# Patient Record
Sex: Male | Born: 1980 | Race: White | Hispanic: No | Marital: Single | Smoking: Never smoker
Health system: Southern US, Community
[De-identification: ages and names within clinical notes are randomized; demographics above are authoritative.]

## PROBLEM LIST (undated history)

## (undated) DIAGNOSIS — R569 Unspecified convulsions: Secondary | ICD-10-CM

## (undated) DIAGNOSIS — F840 Autistic disorder: Secondary | ICD-10-CM

## (undated) HISTORY — PX: EYE SURGERY: SHX253

---

## 2015-10-15 ENCOUNTER — Ambulatory Visit (INDEPENDENT_AMBULATORY_CARE_PROVIDER_SITE_OTHER)
Admit: 2015-10-15 | Discharge: 2015-10-15 | Disposition: A | Discharge status: Home / Self Care | Payer: Self-pay | Attending: Family Medicine | Admitting: Family Medicine

## 2015-10-15 ENCOUNTER — Ambulatory Visit
Admission: EM | Admit: 2015-10-15 | Discharge: 2015-10-15 | Disposition: A | Discharge status: Home / Self Care | Payer: Self-pay | Attending: Family Medicine | Admitting: Family Medicine

## 2015-10-15 DIAGNOSIS — J0100 Acute maxillary sinusitis, unspecified: Secondary | ICD-10-CM

## 2015-10-15 DIAGNOSIS — W19XXXA Unspecified fall, initial encounter: Secondary | ICD-10-CM

## 2015-10-15 DIAGNOSIS — S0093XA Contusion of unspecified part of head, initial encounter: Secondary | ICD-10-CM

## 2015-10-15 HISTORY — DX: Autistic disorder: F84.0

## 2015-10-15 LAB — COMPREHENSIVE METABOLIC PANEL
ALK PHOS: 104 U/L (ref 38–126)
ALT: 30 U/L (ref 17–63)
ANION GAP: 7 (ref 5–15)
AST: 31 U/L (ref 15–41)
Albumin: 4.1 g/dL (ref 3.5–5.0)
BILIRUBIN TOTAL: 0.6 mg/dL (ref 0.3–1.2)
BUN: 13 mg/dL (ref 6–20)
CALCIUM: 9.1 mg/dL (ref 8.9–10.3)
CO2: 28 mmol/L (ref 22–32)
CREATININE: 1.14 mg/dL (ref 0.61–1.24)
Chloride: 102 mmol/L (ref 101–111)
GFR calc non Af Amer: >60 mL/min (ref >60)
GLUCOSE: 117 mg/dL — AB (ref 65–99)
Potassium: 4 mmol/L (ref 3.5–5.1)
Sodium: 137 mmol/L (ref 135–145)
TOTAL PROTEIN: 8.3 g/dL — AB (ref 6.5–8.1)

## 2015-10-15 LAB — CBC WITH DIFFERENTIAL/PLATELET
BASOS ABS: 0.1 10*3/uL (ref 0–0.1)
BASOS PCT: 1 %
EOS ABS: 0.3 10*3/uL (ref 0–0.7)
Eosinophils Relative: 3 %
HEMATOCRIT: 38.6 % — AB (ref 40.0–52.0)
Hemoglobin: 12.9 g/dL — ABNORMAL LOW (ref 13.0–18.0)
Lymphocytes Relative: 11 %
Lymphs Abs: 1 10*3/uL (ref 1.0–3.6)
MCH: 28.5 pg (ref 26.0–34.0)
MCHC: 33.3 g/dL (ref 32.0–36.0)
MCV: 85.7 fL (ref 80.0–100.0)
MONO ABS: 0.5 10*3/uL (ref 0.2–1.0)
MONOS PCT: 5 %
NEUTROS ABS: 7.3 10*3/uL — AB (ref 1.4–6.5)
Neutrophils Relative %: 80 %
PLATELETS: 266 10*3/uL (ref 150–440)
RBC: 4.51 MIL/uL (ref 4.40–5.90)
RDW: 14 % (ref 11.5–14.5)
WBC: 9.2 10*3/uL (ref 3.8–10.6)

## 2015-10-15 LAB — RAPID INFLUENZA A&B ANTIGENS (ARMC ONLY)
INFLUENZA A (ARMC): NOT DETECTED
INFLUENZA B (ARMC): NOT DETECTED

## 2015-10-15 MED ORDER — AMOXICILLIN 400 MG/5ML PO SUSR: 800.0000 mg | Freq: Two times a day (BID) | ORAL | Status: AC

## 2015-10-15 NOTE — ED Provider Notes (Signed)
CSN: [id number]     Arrival date & time 10/15/15  1052 History   First MD Initiated Contact with Patient 10/15/15 1108     Chief Complaint  Patient presents with  . Fall  . Head Injury   (Consider location/radiation/quality/duration/timing/severity/associated sxs/prior Treatment) HPI Comments: 35 yo male with a h/o Autism, presents with mom with a complaint of a fall off a chair at home this morning. Patient states was getting up and not sure if he tripped over something, but fell and hit his head on the floor. Patient states he's not sure if he fainted and fall was not witnessed. Mother heard him fall and came to his room and found him alert, awake and getting up from the floor. Patient denies any vision changes, numbness, tingling, one-sided weakness. No h/o seizures. States has had a "cold" for the past 2 weeks with more recent sinus pressure and congestion.   The history is provided by the patient and a parent.    Past Medical History  Diagnosis Date  . Autism    Past Surgical History  Procedure Laterality Date  . Eye surgery     Family History  Problem Relation Age of Onset  . Hypertension Mother   . Diabetes Father    Social History  Substance Use Topics  . Smoking status: Never Smoker   . Smokeless tobacco: None  . Alcohol Use: No    Review of Systems  Allergies  Review of patient's allergies indicates no known allergies.  Home Medications   Prior to Admission medications   Medication Sig Start Date End Date Taking? Authorizing Provider  amoxicillin (AMOXIL) 400 MG/5ML suspension Take 10 mLs (800 mg total) by mouth 2 (two) times daily. 10/15/15 10/22/15  Payton Mccallum, MD   Meds Ordered and Administered this Visit  Medications - No data to display  BP 93/65 mmHg  Pulse 91  Temp(Src) 98.7 F (37.1 C) (Tympanic)  Resp 16  Ht  (1.676 m)  Wt 275 lb (124.739 kg)  BMI 44.41 kg/m2  SpO2 97% No data found.   Physical Exam  Constitutional: He is oriented  to person, place, and time. He appears well-developed and well-nourished. No distress.  HENT:  Head: Normocephalic. Head is with abrasion and with contusion. Head is without raccoon's eyes, without laceration, without right periorbital erythema and without left periorbital erythema.  Right Ear: Tympanic membrane, external ear and ear canal normal.  Left Ear: Tympanic membrane, external ear and ear canal normal.  Nose: Mucosal edema present. Right sinus exhibits maxillary sinus tenderness. Left sinus exhibits maxillary sinus tenderness.  Mouth/Throat: Uvula is midline, oropharynx is clear and moist and mucous membranes are normal. No oropharyngeal exudate or tonsillar abscesses.  Eyes: Conjunctivae and EOM are normal. Pupils are equal, round, and reactive to light. Right eye exhibits no discharge. Left eye exhibits no discharge. No scleral icterus.  Neck: Normal range of motion. Neck supple. No tracheal deviation present. No thyromegaly present.  Cardiovascular: Normal rate, regular rhythm and normal heart sounds.   Pulmonary/Chest: Effort normal and breath sounds normal. No stridor. No respiratory distress. He has no wheezes. He has no rales. He exhibits no tenderness.  Lymphadenopathy:    He has no cervical adenopathy.  Neurological: He is alert and oriented to person, place, and time. He has normal reflexes. He displays normal reflexes. No cranial nerve deficit. He exhibits normal muscle tone. Coordination normal.  Skin: Skin is warm and dry. No rash noted. He is not diaphoretic.  Nursing note and vitals reviewed.   ED Course  Procedures (including critical care time)  Labs Review Labs Reviewed  COMPREHENSIVE METABOLIC PANEL - Abnormal; Notable for the following:    Glucose, Bld 117 (*)    Total Protein 8.3 (*)    All other components within normal limits  CBC WITH DIFFERENTIAL/PLATELET - Abnormal; Notable for the following:    Hemoglobin 12.9 (*)    HCT 38.6 (*)    Neutro Abs 7.3 (*)     All other components within normal limits  RAPID INFLUENZA A&B ANTIGENS ([facility] ONLY)    Imaging Review Ct Head Wo Contrast  10/15/2015  CLINICAL DATA:  Fall from chair with facial injury EXAM: CT HEAD WITHOUT CONTRAST CT MAXILLOFACIAL WITHOUT CONTRAST TECHNIQUE: Multidetector CT imaging of the head and maxillofacial structures were performed using the standard protocol without intravenous contrast. Multiplanar CT image reconstructions of the maxillofacial structures were also generated. COMPARISON:  None. FINDINGS: CT HEAD FINDINGS The bony calvarium is intact. The ventricles are of normal size and configuration. No findings to suggest acute hemorrhage, acute infarction or space-occupying mass lesion are noted. CT MAXILLOFACIAL FINDINGS No acute bony abnormality is noted. The paranasal sinuses are within normal limits with the exception of mucosal thickening within the right half of the sphenoid sinus. Slight deviation of the nasal septum to the left is noted. The orbits and their contents are within normal limits. Scattered small symmetrical lymph nodes are noted in the cervical chains. IMPRESSION: CT of the head:  No acute abnormality noted. CT of the maxillofacial bones: Mild mucosal changes in the right sphenoid sinus. No acute bony abnormality is seen. Electronically Signed   By: Alcide Clever M.D.   On: 10/15/2015 11:57   Ct Maxillofacial Wo Cm  10/15/2015  CLINICAL DATA:  Fall from chair with facial injury EXAM: CT HEAD WITHOUT CONTRAST CT MAXILLOFACIAL WITHOUT CONTRAST TECHNIQUE: Multidetector CT imaging of the head and maxillofacial structures were performed using the standard protocol without intravenous contrast. Multiplanar CT image reconstructions of the maxillofacial structures were also generated. COMPARISON:  None. FINDINGS: CT HEAD FINDINGS The bony calvarium is intact. The ventricles are of normal size and configuration. No findings to suggest acute hemorrhage, acute infarction or  space-occupying mass lesion are noted. CT MAXILLOFACIAL FINDINGS No acute bony abnormality is noted. The paranasal sinuses are within normal limits with the exception of mucosal thickening within the right half of the sphenoid sinus. Slight deviation of the nasal septum to the left is noted. The orbits and their contents are within normal limits. Scattered small symmetrical lymph nodes are noted in the cervical chains. IMPRESSION: CT of the head:  No acute abnormality noted. CT of the maxillofacial bones: Mild mucosal changes in the right sphenoid sinus. No acute bony abnormality is seen. Electronically Signed   By: Alcide Clever M.D.   On: 10/15/2015 11:57     Visual Acuity Review  Right Eye Distance:   Left Eye Distance:   Bilateral Distance:    Right Eye Near:   Left Eye Near:    Bilateral Near:       Recheck pulse: 91  MDM   1. Fall, initial encounter   2. Fall   3. Head contusion, initial encounter   4. Acute maxillary sinusitis, recurrence not specified    Discharge Medication List as of 10/15/2015 12:14 PM    START taking these medications   Details  amoxicillin (AMOXIL) 400 MG/5ML suspension Take 10 mLs (800 mg total) by  mouth 2 (two) times daily., Starting 10/15/2015, Until Tue 10/22/15, Normal       1. Labs/x-ray results and diagnosis reviewed with patient and parent 2. rx as per orders above; reviewed possible side effects, interactions, risks and benefits  3. Recommend supportive treatment with increased fluids, otc analgesics, ice 4. Recommend establish care with a PCP 5. Follow-up prn if symptoms worsen or don't improve    Payton Mccallum, MD 10/15/15 1621

## 2015-10-15 NOTE — ED Notes (Signed)
Mom states patient fell off chair and landed on head. No recollection of syncope. "I think I passed out". Large abrasion and hematoma forehead. A&Ox3 on assessment by Dr. Judd Gaudier

## 2017-03-24 ENCOUNTER — Ambulatory Visit
Admission: EM | Admit: 2017-03-24 | Discharge: 2017-03-24 | Disposition: A | Discharge status: Home / Self Care | Payer: Medicaid Other | Attending: Family Medicine | Admitting: Family Medicine

## 2017-03-24 ENCOUNTER — Encounter: Payer: Self-pay | Admitting: *Deleted

## 2017-03-24 DIAGNOSIS — J029 Acute pharyngitis, unspecified: Secondary | ICD-10-CM | POA: Diagnosis not present

## 2017-03-24 DIAGNOSIS — J0100 Acute maxillary sinusitis, unspecified: Secondary | ICD-10-CM

## 2017-03-24 DIAGNOSIS — R05 Cough: Secondary | ICD-10-CM | POA: Diagnosis not present

## 2017-03-24 HISTORY — DX: Unspecified convulsions: R56.9

## 2017-03-24 MED ORDER — AMOXICILLIN 875 MG PO TABS: 875.0000 mg | ORAL_TABLET | Freq: Two times a day (BID) | ORAL | 0 refills | Status: DC

## 2017-03-24 MED ORDER — AMOXICILLIN 400 MG/5ML PO SUSR
ORAL | 0 refills | Status: DC
Start: 2017-03-24 — End: 2021-03-11

## 2017-03-24 NOTE — ED Provider Notes (Signed)
[facility]    CSN: [id number] Arrival date & time: 03/24/17  1147     History   Chief Complaint Chief Complaint  Patient presents with  . Cough  . Sore Throat  . Nasal Congestion    HPI Donald HornsDaniel Despain is a 36 y.o. male.   The history is provided by the patient.  Cough  Associated symptoms: headaches, rhinorrhea and sore throat   Associated symptoms: no wheezing   Sore Throat  Associated symptoms include headaches.  URI  Presenting symptoms: congestion, cough, facial pain, rhinorrhea and sore throat   Severity:  Moderate Onset quality:  Sudden Duration:  2 weeks Timing:  Constant Progression:  Worsening Chronicity:  New Relieved by:  None tried Ineffective treatments:  None tried Associated symptoms: headaches and sinus pain   Associated symptoms: no wheezing   Risk factors: not elderly, no chronic cardiac disease, no chronic kidney disease, no chronic respiratory disease, no diabetes mellitus, no immunosuppression, no recent illness, no recent travel and no sick contacts     Past Medical History:  Diagnosis Date  . Autism   . Seizures (HCC)     There are no active problems to display for this patient.   Past Surgical History:  Procedure Laterality Date  . EYE SURGERY         Home Medications    Prior to Admission medications   Medication Sig Start Date End Date Taking? Authorizing Provider  levETIRAcetam (KEPPRA) 100 MG/ML solution Take 1,000 mg by mouth 2 (two) times daily.   Yes [provider]  amoxicillin (AMOXIL) 400 MG/5ML suspension 12 ml po bid for 10 days 03/24/17   Payton Mccallumonty, Bobbye Petti, MD    Family History Family History  Problem Relation Age of Onset  . Hypertension Mother   . Diabetes Father     Social History Social History  Substance Use Topics  . Smoking status: Never Smoker  . Smokeless tobacco: Never Used  . Alcohol use No     Allergies   Patient has no known allergies.   Review of Systems Review  of Systems  HENT: Positive for congestion, rhinorrhea, sinus pain and sore throat.   Respiratory: Positive for cough. Negative for wheezing.   Neurological: Positive for headaches.     Physical Exam Triage Vital Signs ED Triage Vitals  Enc Vitals Group     BP 03/24/17 1159 109/73     Pulse Rate 03/24/17 1159 61     Resp 03/24/17 1159 16     Temp 03/24/17 1159 98.5 F (36.9 C)     Temp Source 03/24/17 1159 Oral     SpO2 03/24/17 1159 98 %     Weight 03/24/17 1201 256 lb (116.1 kg)     Height 03/24/17 1201 5\' 5"  (1.651 m)     Head Circumference --      Peak Flow --      Pain Score --      Pain Loc --      Pain Edu? --      Excl. in GC? --    No data found.   Updated Vital Signs BP 109/73 (BP Location: Left Arm)   Pulse 61   Temp 98.5 F (36.9 C) (Oral)   Resp 16   Ht 5\' 5"  (1.651 m)   Wt 256 lb (116.1 kg)   SpO2 98%   BMI 42.60 kg/m   Visual Acuity Right Eye Distance:   Left Eye Distance:   Bilateral Distance:  Right Eye Near:   Left Eye Near:    Bilateral Near:     Physical Exam  Constitutional: He appears well-developed and well-nourished. No distress.  HENT:  Head: Normocephalic and atraumatic.  Right Ear: Tympanic membrane, external ear and ear canal normal.  Left Ear: Tympanic membrane, external ear and ear canal normal.  Nose: Right sinus exhibits maxillary sinus tenderness. Right sinus exhibits no frontal sinus tenderness. Left sinus exhibits maxillary sinus tenderness. Left sinus exhibits no frontal sinus tenderness.  Mouth/Throat: Uvula is midline, oropharynx is clear and moist and mucous membranes are normal. No oropharyngeal exudate or tonsillar abscesses.  Eyes: Pupils are equal, round, and reactive to light. Conjunctivae and EOM are normal. Right eye exhibits no discharge. Left eye exhibits no discharge. No scleral icterus.  Neck: Normal range of motion. Neck supple. No tracheal deviation present. No thyromegaly present.  Cardiovascular:  Normal rate, regular rhythm and normal heart sounds.   Pulmonary/Chest: Effort normal and breath sounds normal. No stridor. No respiratory distress. He has no wheezes. He has no rales. He exhibits no tenderness.  Lymphadenopathy:    He has no cervical adenopathy.  Neurological: He is alert.  Skin: Skin is warm and dry. No rash noted. He is not diaphoretic.  Nursing note and vitals reviewed.    UC Treatments / Results  Labs (all labs ordered are listed, but only abnormal results are displayed) Labs Reviewed - No data to display  EKG  EKG Interpretation None       Radiology No results found.  Procedures Procedures (including critical care time)  Medications Ordered in UC Medications - No data to display   Initial Impression / Assessment and Plan / UC Course  I have reviewed the triage vital signs and the nursing notes.  Pertinent labs & imaging results that were available during my care of the patient were reviewed by me and considered in my medical decision making (see chart for details).       Final Clinical Impressions(s) / UC Diagnoses   Final diagnoses:  Acute maxillary sinusitis, recurrence not specified    New Prescriptions Discharge Medication List as of 03/24/2017 12:10 PM    START taking these medications   Details  amoxicillin (AMOXIL) 875 MG tablet Take 1 tablet (875 mg total) by mouth 2 (two) times daily., Starting Wed 03/24/2017, Normal       1. diagnosis reviewed with patient 2. rx as per orders above; reviewed possible side effects, interactions, risks and benefits  3. Recommend supportive treatment with otc flonase 4. Follow-up prn if symptoms worsen or don't improve   Payton Mccallumonty, Shalawn Wynder, MD 03/24/17 [phone]

## 2017-03-24 NOTE — ED Triage Notes (Signed)
Sore throat, non-productive cough, runny nose, x1 week. Pt dx with seizure disorder 1 year ago and last seizure was 1 week ago, states awoke from seizure with sore throat that hasn't resolved.

## 2017-08-06 ENCOUNTER — Encounter: Payer: Self-pay | Admitting: *Deleted

## 2017-08-06 ENCOUNTER — Ambulatory Visit
Admission: EM | Admit: 2017-08-06 | Discharge: 2017-08-06 | Disposition: A | Discharge status: Home / Self Care | Payer: Medicaid Other | Attending: Family Medicine | Admitting: Family Medicine

## 2017-08-06 ENCOUNTER — Other Ambulatory Visit: Payer: Self-pay

## 2017-08-06 DIAGNOSIS — R05 Cough: Secondary | ICD-10-CM | POA: Diagnosis not present

## 2017-08-06 DIAGNOSIS — R059 Cough, unspecified: Secondary | ICD-10-CM

## 2017-08-06 DIAGNOSIS — J069 Acute upper respiratory infection, unspecified: Secondary | ICD-10-CM | POA: Diagnosis not present

## 2017-08-06 DIAGNOSIS — R030 Elevated blood-pressure reading, without diagnosis of hypertension: Secondary | ICD-10-CM

## 2017-08-06 NOTE — Discharge Instructions (Signed)
Advised Mucinex DM OTC. Keep BP log x 2 weeks and FU with PCP. Tylenol/Motrin as needed Increase rest and hydration

## 2017-08-06 NOTE — ED Provider Notes (Signed)
[facility]    CSN: [id number] Arrival date & time: 08/06/17  1454     History   Chief Complaint Chief Complaint  Patient presents with  . Cough  . Nasal Congestion    HPI Donald Watson is a 36 y.o. male.   The history is provided by the patient.  Cough  Cough characteristics:  Dry and productive Sputum characteristics:  Clear Severity:  Moderate Onset quality:  Gradual Duration:  2 weeks Timing:  Constant Denies fever/chills  Past Medical History:  Diagnosis Date  . Autism   . Seizures (HCC)     There are no active problems to display for this patient.   Past Surgical History:  Procedure Laterality Date  . EYE SURGERY         Home Medications    Prior to Admission medications   Medication Sig Start Date End Date Taking? Authorizing Provider  levETIRAcetam (KEPPRA) 100 MG/ML solution Take 1,000 mg by mouth 2 (two) times daily.   Yes [provider]  amoxicillin (AMOXIL) 400 MG/5ML suspension 12 ml po bid for 10 days 03/24/17   Payton Mccallumonty, [place], MD    Family History Family History  Problem Relation Age of Onset  . Hypertension Mother   . Diabetes Father     Social History Social History   Tobacco Use  . Smoking status: Never Smoker  . Smokeless tobacco: Never Used  Substance Use Topics  . Alcohol use: No  . Drug use: No     Allergies   Patient has no known allergies.   Review of Systems Review of Systems  Constitutional: Negative.   HENT: Positive for congestion.   Respiratory: Positive for cough.   Skin: Negative.   Neurological: Negative.   Psychiatric/Behavioral: Negative.      Physical Exam Triage Vital Signs ED Triage Vitals  Enc Vitals Group     BP 08/06/17 1514 (!) 188/70     Pulse Rate 08/06/17 1514 82     Resp 08/06/17 1514 18     Temp 08/06/17 1514 98.4 F (36.9 C)     Temp Source 08/06/17 1514 Oral     SpO2 08/06/17 1514 96 %     Weight 08/06/17 1515 255 lb (115.7 kg)     Height --    Head Circumference --      Peak Flow --      Pain Score 08/06/17 1516 0     Pain Loc --      Pain Edu? --      Excl. in GC? --    No data found.  Updated Vital Signs BP (!) 188/70 (BP Location: Left Arm)   Pulse 82   Temp 98.4 F (36.9 C) (Oral)   Resp 18   Wt 255 lb (115.7 kg)   SpO2 96%   BMI 42.43 kg/m   Visual Acuity Right Eye Distance:   Left Eye Distance:   Bilateral Distance:    Right Eye Near:   Left Eye Near:    Bilateral Near:     Physical Exam  Constitutional: He is oriented to person, place, and time. He appears well-developed and well-nourished.  Eyes: Pupils are equal, round, and reactive to light.  Neck: Normal range of motion.  Cardiovascular: Normal rate, regular rhythm and normal heart sounds.  Pulmonary/Chest: Effort normal and breath sounds normal.  Cough with clear to yellow expectorant   Neurological: He is alert and oriented to person, place, and time.  Skin: Skin is  warm.     UC Treatments / Results  Labs (all labs ordered are listed, but only abnormal results are displayed) Labs Reviewed - No data to display  EKG  EKG Interpretation None       Radiology No results found.  Procedures Procedures (including critical care time)  Medications Ordered in UC Medications - No data to display   Initial Impression / Assessment and Plan / UC Course  I have reviewed the triage vital signs and the nursing notes.  Pertinent labs & imaging results that were available during my care of the patient were reviewed by me and considered in my medical decision making (see chart for details).    BP reading elevated. Findings discussed with patient and mother. Keep BP log x 2 weeks and FU with PCP.  Final Clinical Impressions(s) / UC Diagnoses   Final diagnoses:  Acute upper respiratory infection  Cough  Elevated BP without diagnosis of hypertension    ED Discharge Orders    None       Controlled Substance Prescriptions [place]  Controlled Substance Registry consulted? Not Applicable   Reinaldo RaddleMultani, Ricard Faulkner, NP 08/06/17 1557

## 2017-08-06 NOTE — ED Triage Notes (Signed)
Patient started having symptoms of cough and nasal congestion 3 weeks ago.

## 2021-03-11 ENCOUNTER — Ambulatory Visit
Admission: EM | Admit: 2021-03-11 | Discharge: 2021-03-11 | Disposition: A | Discharge status: Home / Self Care | Payer: Medicaid Other | Attending: Emergency Medicine | Admitting: Emergency Medicine

## 2021-03-11 ENCOUNTER — Other Ambulatory Visit: Payer: Self-pay

## 2021-03-11 DIAGNOSIS — L409 Psoriasis, unspecified: Secondary | ICD-10-CM

## 2021-03-11 MED ORDER — BETAMETHASONE DIPROPIONATE 0.05 % EX OINT: TOPICAL_OINTMENT | Freq: Two times a day (BID) | CUTANEOUS | 0 refills | Status: AC

## 2021-03-11 NOTE — ED Triage Notes (Signed)
Pt c/o rash on both forearms for about 2 weeks, pt states areas do not itch. Mom has applied alcohol and abx ointment, none have helped. Pt does have hx of psoriasis.

## 2021-03-11 NOTE — Discharge Instructions (Addendum)
Apply the betamethasone ointment twice daily to the rash to help calm down inflammation.  Following your bath or shower use a thick moisturizing lotion such as Eucerin or CeraVe a.  This will help return moisture to the skin and help calm down some of the inflammation and irritation of the psoriasis.  If your symptoms do not improve, or they worsen, return for reevaluation or see your primary care provider.

## 2021-03-11 NOTE — ED Provider Notes (Signed)
[facility]    CSN: [id number] Arrival date & time: 03/11/21  1329      History   Chief Complaint Chief Complaint  Patient presents with   Rash    HPI Kou Gucciardo is a 40 y.o. male.   HPI  40 year old male here for evaluation of rash.  Patient is here with his mother and reports that he has been experiencing a red rash on both arms with the left being greater than the right for the past 2 weeks.  Mom reports that she has been using topical antibiotic ointment but has not improved the rash.  Patient denies any itching, heat, or drainage from the wound.  They are red raised plaques and both mom and patient deny any silver flaky crust.  She does have a history of psoriasis.  Past Medical History:  Diagnosis Date   Autism    Seizures (HCC)     There are no problems to display for this patient.   Past Surgical History:  Procedure Laterality Date   EYE SURGERY         Home Medications    Prior to Admission medications   Medication Sig Start Date End Date Taking? Authorizing Provider  betamethasone dipropionate (DIPROLENE) 0.05 % ointment Apply topically 2 (two) times daily. 03/11/21  Yes Becky Augusta, NP  levETIRAcetam (KEPPRA) 100 MG/ML solution Take 1,000 mg by mouth 2 (two) times daily.   Yes [provider]    Family History Family History  Problem Relation Age of Onset   Hypertension Mother    Diabetes Father     Social History Social History   Tobacco Use   Smoking status: Never   Smokeless tobacco: Never  Substance Use Topics   Alcohol use: No   Drug use: No     Allergies   Patient has no known allergies.   Review of Systems Review of Systems  Constitutional:  Negative for activity change and fever.  Skin:  Positive for rash.    Physical Exam Triage Vital Signs ED Triage Vitals  Enc Vitals Group     BP 03/11/21 1348 115/71     Pulse Rate 03/11/21 1348 75     Resp 03/11/21 1348 18     Temp 03/11/21 1348 98.3  F (36.8 C)     Temp Source 03/11/21 1348 Oral     SpO2 03/11/21 1348 98 %     Weight 03/11/21 1347 245 lb (111.1 kg)     Height 03/11/21 1347 5\' 6"  (1.676 m)     Head Circumference --      Peak Flow --      Pain Score 03/11/21 1347 0     Pain Loc --      Pain Edu? --      Excl. in GC? --    No data found.  Updated Vital Signs BP 115/71 (BP Location: Left Arm)   Pulse 75   Temp 98.3 F (36.8 C) (Oral)   Resp 18   Ht 5\' 6"  (1.676 m)   Wt 245 lb (111.1 kg)   SpO2 98%   BMI 39.54 kg/m   Visual Acuity Right Eye Distance:   Left Eye Distance:   Bilateral Distance:    Right Eye Near:   Left Eye Near:    Bilateral Near:     Physical Exam Vitals and nursing note reviewed.  Constitutional:      General: He is not in acute distress.    Appearance:  Normal appearance. He is not ill-appearing.  Skin:    General: Skin is warm and dry.     Capillary Refill: Capillary refill takes less than 2 seconds.     Findings: Erythema and rash present.  Neurological:     General: No focal deficit present.     Mental Status: He is alert and oriented to person, place, and time.  Psychiatric:        Mood and Affect: Mood normal.        Behavior: Behavior normal.        Thought Content: Thought content normal.        Judgment: Judgment normal.     UC Treatments / Results  Labs (all labs ordered are listed, but only abnormal results are displayed) Labs Reviewed - No data to display  EKG   Radiology No results found.  Procedures Procedures (including critical care time)  Medications Ordered in UC Medications - No data to display  Initial Impression / Assessment and Plan / UC Course  I have reviewed the triage vital signs and the nursing notes.  Pertinent labs & imaging results that were available during my care of the patient were reviewed by me and considered in my medical decision making (see chart for details).  Patient is a very pleasant 40 year old male with a  history of autism that is here for evaluation of rash on both forearms that is been present for the past 2 weeks as outlined in HPI above.  Patient's physical exam reveals the presence of a rash primarily on the dorsal and lateral surface of the left forearm and in the lateral antecubital fossa creases of both forearms.  Patient does have a scattered patch further down on the right forearm as well.  The rash is erythematous and raised in patches.  There is no Superflex skin on top.  The rash is not hot to the touch and patient does not complain of any itching.  There is no drainage from the wound.  Patient's exam is consistent with psoriasis.  We will treat patient with topical triamcinolone twice daily to calm down inflammation and I have instructed mom and patient to apply a thick emollient moisturizing lotion such as Eucerin or CeraVe a immediately following the shower or bath so that the skin is most receptive to receiving the moisture.   Final Clinical Impressions(s) / UC Diagnoses   Final diagnoses:  Psoriasis     Discharge Instructions      Apply the betamethasone ointment twice daily to the rash to help calm down inflammation.  Following your bath or shower use a thick moisturizing lotion such as Eucerin or CeraVe a.  This will help return moisture to the skin and help calm down some of the inflammation and irritation of the psoriasis.  If your symptoms do not improve, or they worsen, return for reevaluation or see your primary care provider.     ED Prescriptions     Medication Sig Dispense Auth. Provider   betamethasone dipropionate (DIPROLENE) 0.05 % ointment Apply topically 2 (two) times daily. 30 g Becky Augusta, NP      PDMP not reviewed this encounter.   Becky Augusta, NP 03/11/21 1410

## 2021-04-22 ENCOUNTER — Other Ambulatory Visit: Payer: Self-pay | Admitting: Neurology

## 2021-04-22 DIAGNOSIS — R569 Unspecified convulsions: Secondary | ICD-10-CM

## 2021-04-23 ENCOUNTER — Other Ambulatory Visit: Payer: Self-pay | Admitting: Neurology

## 2021-04-23 DIAGNOSIS — R569 Unspecified convulsions: Secondary | ICD-10-CM

## 2021-04-30 ENCOUNTER — Other Ambulatory Visit: Payer: Self-pay

## 2021-04-30 ENCOUNTER — Ambulatory Visit
Admission: RE | Admit: 2021-04-30 | Discharge: 2021-04-30 | Disposition: A | Discharge status: Home / Self Care | Payer: Medicaid Other | Source: Ambulatory Visit | Attending: Neurology | Admitting: Neurology

## 2021-04-30 DIAGNOSIS — R569 Unspecified convulsions: Secondary | ICD-10-CM | POA: Diagnosis not present

## 2021-04-30 MED ORDER — GADOBUTROL 1 MMOL/ML IV SOLN
10.0000 mL | Freq: Once | INTRAVENOUS | Status: AC | PRN
  Administered 2021-04-30: 10 mL via INTRAVENOUS

## 2022-02-27 ENCOUNTER — Ambulatory Visit
Admission: EM | Admit: 2022-02-27 | Discharge: 2022-02-27 | Disposition: A | Discharge status: Home / Self Care | Payer: Medicaid Other | Attending: Emergency Medicine | Admitting: Emergency Medicine

## 2022-02-27 ENCOUNTER — Encounter: Payer: Self-pay | Admitting: Emergency Medicine

## 2022-02-27 DIAGNOSIS — R21 Rash and other nonspecific skin eruption: Secondary | ICD-10-CM | POA: Diagnosis not present

## 2022-02-27 MED ORDER — PREDNISOLONE 15 MG/5ML PO SOLN: ORAL | 0 refills | Status: DC

## 2022-02-27 NOTE — Discharge Instructions (Addendum)
The cause of your rash is unknown at this time, at this time it does not appear to be infectious rash and therefore we will move forward with treatment for inflammation  Begin use of prednisone every morning with food as directed on packaging  If itching begins to recur you may use an oral antihistamine such as Zyrtec or Claritin or a topical product such as calamine lotion  If pain begins you may use Tylenol or ibuprofen every 6 hours as needed for general comfort  Avoid long exposure to high heat such as showers or outside as this may cause further irritation to the rash  At any point if your symptoms persist or worsen you may follow-up with urgent care for reevaluation

## 2022-02-27 NOTE — ED Triage Notes (Signed)
Patient c/o red rash on his right upper arm that started earlier this week.  Patient denies itching or pain.  Patient has been putting hydrocortisone on the area.

## 2022-02-27 NOTE — ED Provider Notes (Signed)
[facility]    CSN: [id number] Arrival date & time: 02/27/22  1048      History   Chief Complaint Chief Complaint  Patient presents with   Rash    HPI Donald Watson is a 41 y.o. male.   Patient presents with erythematous smooth oriented rash to the right upper arm beginning 7 days ago.  Has attempted use of hydrocortisone cream which has been ineffective.  Denies pruritus, drainage, pain, fever, chills.  Denies changes in soaps, lotions detergents fragrance, hair products, dietary changes or recent travel.  Denies exposure to wooded or implanted areas.  No other member of household has similar rash.  History of psoriasis but endorses presentation of new rash is not consistent.  History of autism and seizures.   Past Medical History:  Diagnosis Date   Autism    Seizures (HCC)     There are no problems to display for this patient.   Past Surgical History:  Procedure Laterality Date   EYE SURGERY         Home Medications    Prior to Admission medications   Medication Sig Start Date End Date Taking? Authorizing Provider  levETIRAcetam (KEPPRA) 100 MG/ML solution Take 1,000 mg by mouth 2 (two) times daily.   Yes [provider]  betamethasone dipropionate (DIPROLENE) 0.05 % ointment Apply topically 2 (two) times daily. 03/11/21   Becky Augusta, NP    Family History Family History  Problem Relation Age of Onset   Hypertension Mother    Diabetes Father     Social History Social History   Tobacco Use   Smoking status: Never   Smokeless tobacco: Never  Vaping Use   Vaping Use: Never used  Substance Use Topics   Alcohol use: No   Drug use: No     Allergies   Patient has no known allergies.   Review of Systems Review of Systems  Constitutional: Negative.   Respiratory: Negative.    Cardiovascular: Negative.   Skin:  Positive for rash. Negative for color change, pallor and wound.  Neurological: Negative.      Physical  Exam Triage Vital Signs ED Triage Vitals  Enc Vitals Group     BP 02/27/22 1116 109/77     Pulse Rate 02/27/22 1116 66     Resp 02/27/22 1116 15     Temp 02/27/22 1116 98.6 F (37 C)     Temp Source 02/27/22 1116 Oral     SpO2 02/27/22 1116 96 %     Weight 02/27/22 1114 272 lb (123.4 kg)     Height 02/27/22 1114 5\' 6"  (1.676 m)     Head Circumference --      Peak Flow --      Pain Score 02/27/22 1114 0     Pain Loc --      Pain Edu? --      Excl. in GC? --    No data found.  Updated Vital Signs BP 109/77 (BP Location: Left Arm)   Pulse 66   Temp 98.6 F (37 C) (Oral)   Resp 15   Ht 5\' 6"  (1.676 m)   Wt 272 lb (123.4 kg)   SpO2 96%   BMI 43.90 kg/m   Visual Acuity Right Eye Distance:   Left Eye Distance:   Bilateral Distance:    Right Eye Near:   Left Eye Near:    Bilateral Near:     Physical Exam Constitutional:  Appearance: Normal appearance.  HENT:     Head: Normocephalic.  Eyes:     Extraocular Movements: Extraocular movements intact.  Pulmonary:     Effort: Pulmonary effort is normal.  Skin:    Comments: Erythematous macule present to the posterior of the right upper extremity, deferred picture below  Neurological:     Mental Status: He is alert and oriented to person, place, and time. Mental status is at baseline.  Psychiatric:        Mood and Affect: Mood normal.        Behavior: Behavior normal.       UC Treatments / Results  Labs (all labs ordered are listed, but only abnormal results are displayed) Labs Reviewed - No data to display  EKG   Radiology No results found.  Procedures Procedures (including critical care time)  Medications Ordered in UC Medications - No data to display  Initial Impression / Assessment and Plan / UC Course  I have reviewed the triage vital signs and the nursing notes.  Pertinent labs & imaging results that were available during my care of the patient were reviewed by me and considered in my  medical decision making (see chart for details).  Rash  Unknown etiology, discussed with patient and parent,  Prednisone taper prescribed for outpatient use, may use oral antihistamines if pruritus occurs and over-the-counter analgesics for management of discomfort if this begins to occur, advised to avoid long exposure to heat to prevent further irritation, may follow-up with this urgent care as needed if symptoms persist or worsen Final Clinical Impressions(s) / UC Diagnoses   Final diagnoses:  None   Discharge Instructions   None    ED Prescriptions   None    PDMP not reviewed this encounter.   Valinda Hoar, NP 02/27/22 1138

## 2022-10-31 IMAGING — MR MR HEAD WO/W CM
9 series · 48 of 48 positions shown · IV contrast (gadavist)
Comparison: Head CT 10/15/2015.

CLINICAL DATA: Seizure-like activity (HCC) F45.A ([id number]).
Additional history provided by scanning technologist: Patient
reports seizures for several years, most recent seizure a few weeks
ago.

EXAM:
MRI HEAD WITHOUT AND WITH CONTRAST
TECHNIQUE: Multiplanar, multiecho pulse sequences of the brain and surrounding
structures were obtained without and with intravenous contrast.
CONTRAST:  10mL GADAVIST GADOBUTROL 1 MMOL/ML IV SOLN

[Series 2: DWI · axial · 3.0mm · 1.20mm/px · z∈[-97,+64]mm · 9 of 110 slices shown (1 of 3)]
[im 1/110]
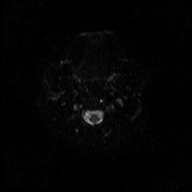
[im 14/110]
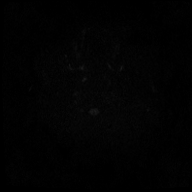
[im 28/110]
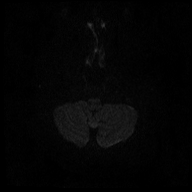
[im 41/110]
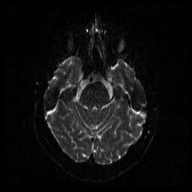
[im 55/110]
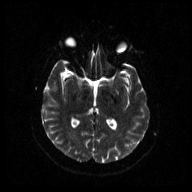
[im 69/110]
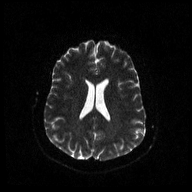
[im 82/110]
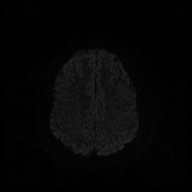
[im 96/110]
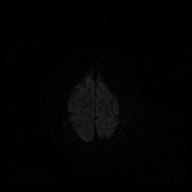
[im 110/110]
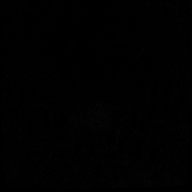

[Series 4: T1 · sagittal · 5.0mm · 0.45mm/px · 2 of 23 slices shown (1 of 2)]
[im 1/23]
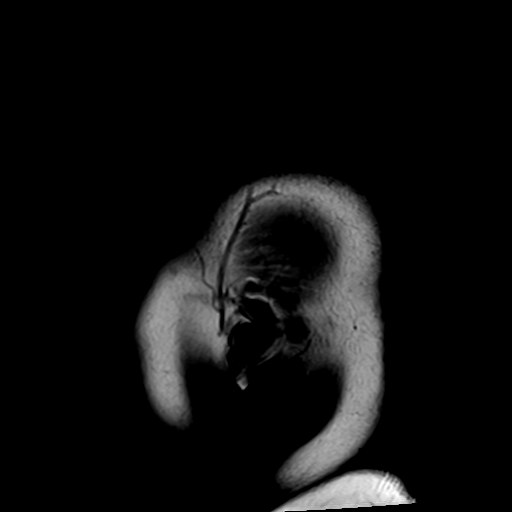
[im 23/23]
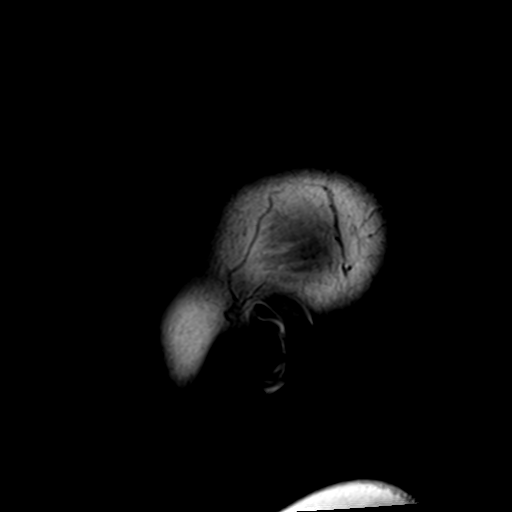

[Series 5: DWI · coronal · 3.0mm · 1.15mm/px · 6 of 90 slices shown (2 of 3)]
[im 1/90]
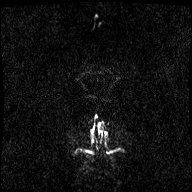
[im 18/90]
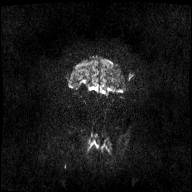
[im 36/90]
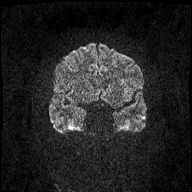
[im 54/90]
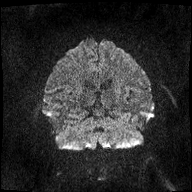
[im 72/90]
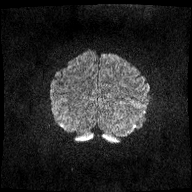
[im 90/90]
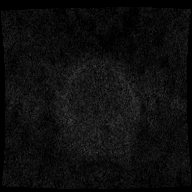

[Series 6: DWI · coronal · 3.0mm · 1.15mm/px · 3 of 45 slices shown (3 of 3)]
[im 1/45]
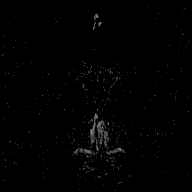
[im 23/45]
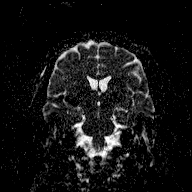
[im 45/45]
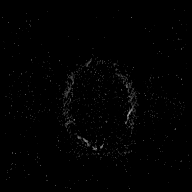

[Series 9: T2 · axial · 5.0mm · 0.72mm/px · z∈[-94,+59]mm · 2 of 23 slices shown (1 of 2)]
[im 1/23]
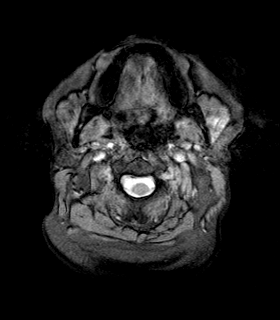
[im 23/23]
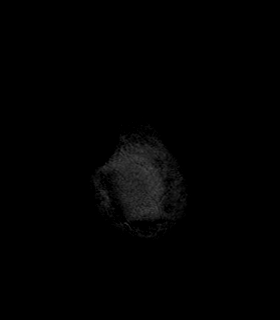

[Series 10: T1 · axial · 1.0mm · 0.94mm/px · z∈[-97,+61]mm · 11 of 160 slices shown (2 of 2)]
[im 1/160]
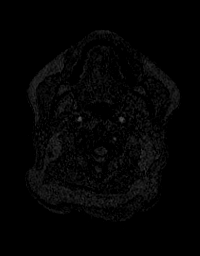
[im 16/160]
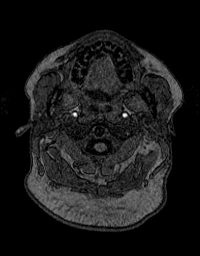
[im 32/160]
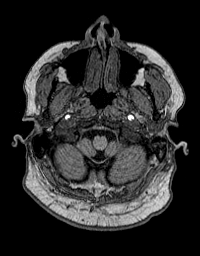
[im 48/160]
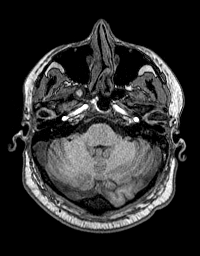
[im 64/160]
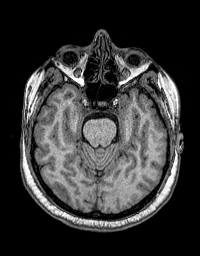
[im 80/160]
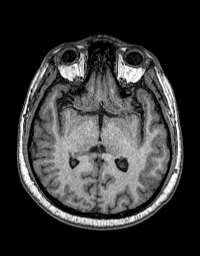
[im 96/160]
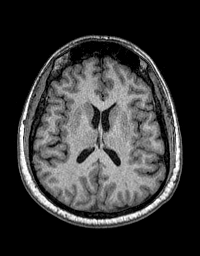
[im 112/160]
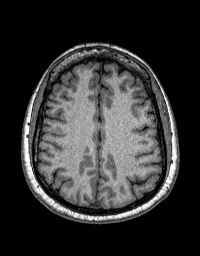
[im 128/160]
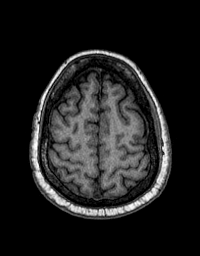
[im 144/160]
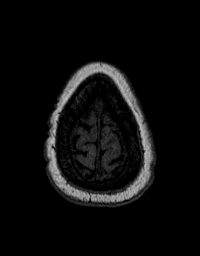
[im 160/160]
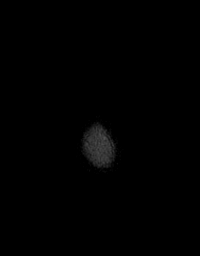

[Series 12: T2 · coronal · 3.0mm · 0.70mm/px · 2 of 35 slices shown (2 of 2)]
[im 1/35]
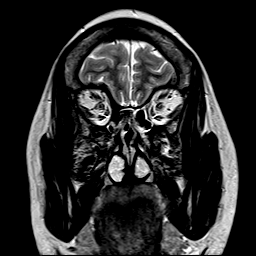
[im 35/35]
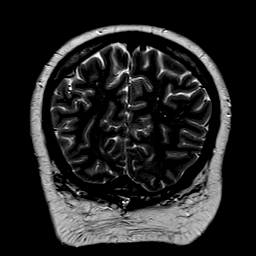

[Series 13: T1 post-contrast · axial · 1.0mm · 0.94mm/px · z∈[-97,+61]mm · 11 of 160 slices shown (1 of 2)]
[im 1/160]
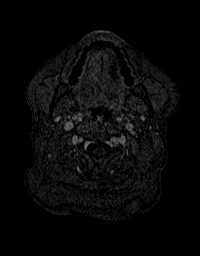
[im 16/160]
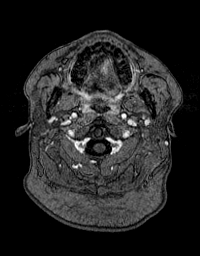
[im 32/160]
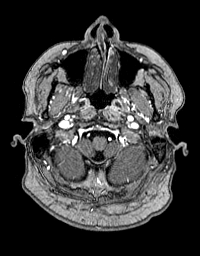
[im 48/160]
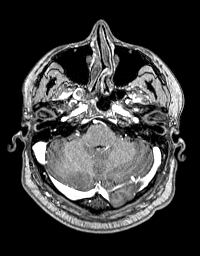
[im 64/160]
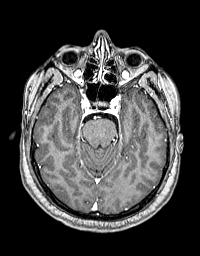
[im 80/160]
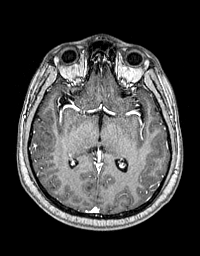
[im 96/160]
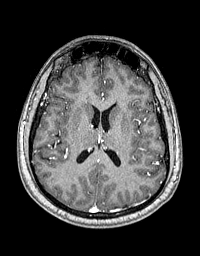
[im 112/160]
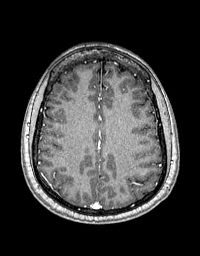
[im 128/160]
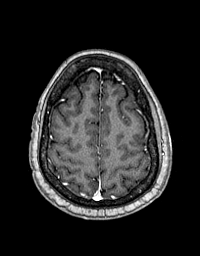
[im 144/160]
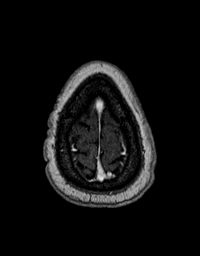
[im 160/160]
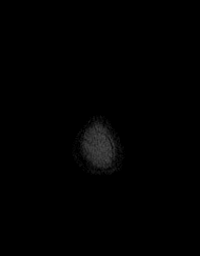

[Series 14: T1 post-contrast · coronal · 5.0mm · 0.43mm/px · 2 of 31 slices shown (2 of 2)]
[im 1/31]
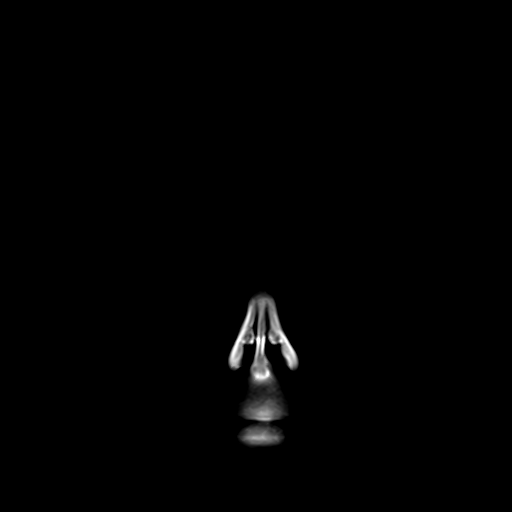
[im 31/31]
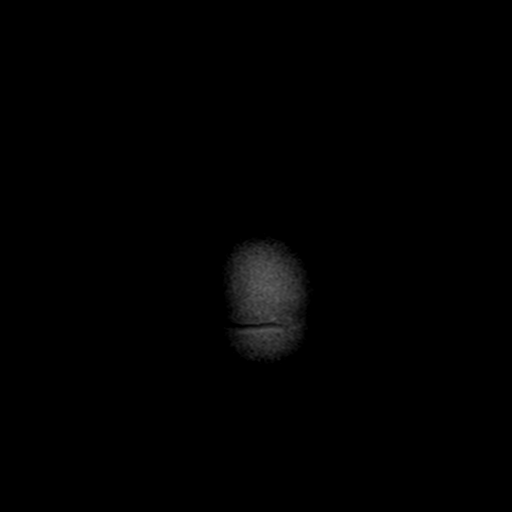

[48 of 48 positions shown; findings below may reference images not displayed]

FINDINGS: Brain:

Cerebral volume is normal.

No cortical encephalomalacia is identified. No significant cerebral
white matter disease.

The hippocampi are symmetric in size and signal.

There is no acute infarct.

No evidence of an intracranial mass.

No chronic intracranial blood products.

No extra-axial fluid collection.

No midline shift.

Partially empty sella turcica, nonspecific.

No pathologic intracranial enhancement.

Vascular: Maintained flow voids within the proximal large arterial
vessels. Small left frontal lobe developmental venous anomalies.

Skull and upper cervical spine: No focal suspicious marrow lesion.

Sinuses/Orbits: Visualized orbits show no acute finding. Mild
mucosal thickening within the left ethmoid and right sphenoid
sinuses.

Other: Small bilateral mastoid effusions.
IMPRESSION: Partially empty sella turcica, a nonspecific finding.

Otherwise unremarkable MRI appearance of the brain. No evidence of
acute intracranial abnormality.

No specific seizure focus is identified.

Mild mucosal thickening within the left ethmoid and right sphenoid
sinuses.

Small bilateral mastoid effusions.

## 2023-03-30 ENCOUNTER — Ambulatory Visit (INDEPENDENT_AMBULATORY_CARE_PROVIDER_SITE_OTHER): Payer: MEDICAID

## 2023-03-30 ENCOUNTER — Ambulatory Visit
Admission: EM | Admit: 2023-03-30 | Discharge: 2023-03-30 | Disposition: A | Discharge status: Home / Self Care | Payer: MEDICAID | Attending: Physician Assistant | Admitting: Physician Assistant

## 2023-03-30 DIAGNOSIS — R051 Acute cough: Secondary | ICD-10-CM

## 2023-03-30 DIAGNOSIS — B349 Viral infection, unspecified: Secondary | ICD-10-CM | POA: Diagnosis not present

## 2023-03-30 MED ORDER — PROMETHAZINE-DM 6.25-15 MG/5ML PO SYRP: 5.0000 mL | ORAL_SOLUTION | Freq: Four times a day (QID) | ORAL | 0 refills | Status: DC | PRN

## 2023-03-30 NOTE — ED Triage Notes (Signed)
Pt c/o cough & wheezing x1 mon. Denies any hx of asthma,CP or COPD. Has tried cetrizine hcl w/o relief.

## 2023-03-30 NOTE — ED Provider Notes (Signed)
[facility]    CSN: [id number] Arrival date & time: 03/30/23  1036      History   Chief Complaint Chief Complaint  Patient presents with   Cough   Wheezing    HPI Donald Watson is a 42 y.o. male with history of autism, developmental delay and seizures.  He presents with his mother and caregiver.  She helps to provide some of the history but patient is able to answer all of the questions.  He reports a dry cough for the past 1 month.  Also reports occasional wheezing.  Denies fever, fatigue, chest pain/tightness or shortness of breath.  Mother states the cough has gotten a little better but has lingered.  He was seen at [facility] and given cough medication.  Mother says he finished a cough medication and cough continues.  No recent worsening of symptoms.  Denies sore throat, congestion, sinus pain.  No history of asthma, COPD or other lung disease.  HPI  Past Medical History:  Diagnosis Date   Autism    Seizures (HCC)     There are no problems to display for this patient.   Past Surgical History:  Procedure Laterality Date   EYE SURGERY         Home Medications    Prior to Admission medications   Medication Sig Start Date End Date Taking? Authorizing Provider  levETIRAcetam (KEPPRA) 100 MG/ML solution Take 1,000 mg by mouth 2 (two) times daily.   Yes [provider]  promethazine-dextromethorphan (PROMETHAZINE-DM) 6.25-15 MG/5ML syrup Take 5 mLs by mouth 4 (four) times daily as needed. 03/30/23  Yes Eusebio Friendly B, PA-C  betamethasone dipropionate (DIPROLENE) 0.05 % ointment Apply topically 2 (two) times daily. 03/11/21   Becky Augusta, NP    Family History Family History  Problem Relation Age of Onset   Hypertension Mother    Diabetes Father     Social History Social History   Tobacco Use   Smoking status: Never   Smokeless tobacco: Never  Vaping Use   Vaping status: Never Used  Substance Use Topics   Alcohol use: No   Drug  use: No     Allergies   Patient has no known allergies.   Review of Systems Review of Systems  Constitutional:  Negative for fatigue and fever.  HENT:  Negative for congestion, rhinorrhea, sinus pressure, sinus pain and sore throat.   Respiratory:  Positive for cough and wheezing. Negative for shortness of breath.   Cardiovascular:  Negative for chest pain.  Gastrointestinal:  Negative for abdominal pain, diarrhea, nausea and vomiting.  Musculoskeletal:  Negative for myalgias.  Neurological:  Negative for weakness, light-headedness and headaches.  Hematological:  Negative for adenopathy.     Physical Exam Triage Vital Signs ED Triage Vitals  Encounter Vitals Group     BP      Systolic BP Percentile      Diastolic BP Percentile      Pulse      Resp      Temp      Temp src      SpO2      Weight      Height      Head Circumference      Peak Flow      Pain Score      Pain Loc      Pain Education      Exclude from Growth Chart    No data found.  Updated Vital Signs  BP 118/79 (BP Location: Left Arm)   Pulse 70   Temp 98.6 F (37 C) (Oral)   Resp 16   Ht 5\' 8"  (1.727 m)   Wt 240 lb (108.9 kg)   SpO2 98%   BMI 36.49 kg/m   Physical Exam Vitals and nursing note reviewed.  Constitutional:      General: He is not in acute distress.    Appearance: Normal appearance. He is well-developed. He is not ill-appearing.  HENT:     Head: Normocephalic and atraumatic.     Nose: Nose normal.     Comments: Small amount of clear postnasal drainage.    Mouth/Throat:     Mouth: Mucous membranes are moist.     Pharynx: Oropharynx is clear.  Eyes:     General: No scleral icterus.    Conjunctiva/sclera: Conjunctivae normal.  Cardiovascular:     Rate and Rhythm: Normal rate and regular rhythm.     Heart sounds: Normal heart sounds.  Pulmonary:     Effort: Pulmonary effort is normal. No respiratory distress.     Breath sounds: Normal breath sounds. No wheezing, rhonchi or  rales.  Musculoskeletal:     Cervical back: Neck supple.  Skin:    General: Skin is warm and dry.     Capillary Refill: Capillary refill takes less than 2 seconds.  Neurological:     General: No focal deficit present.     Mental Status: He is alert. Mental status is at baseline.     Motor: No weakness.     Gait: Gait normal.  Psychiatric:        Mood and Affect: Mood normal.        Behavior: Behavior normal.      UC Treatments / Results  Labs (all labs ordered are listed, but only abnormal results are displayed) Labs Reviewed - No data to display  EKG   Radiology DG Chest 2 View  Result Date: 03/30/2023 CLINICAL DATA:  Cough and wheezing for 1 month. EXAM: CHEST - 2 VIEW COMPARISON:  None Available. FINDINGS: Cardiac silhouette and mediastinal contours are within normal limits. The lungs are clear. No pleural effusion or pneumothorax. Mild dextrocurvature of the midthoracic spine. Minimal kyphotic angulation of the inferior thoracic spine on lateral view. IMPRESSION: No active cardiopulmonary disease. Electronically Signed   By: Neita Garnet M.D.   On: 03/30/2023 11:50    Procedures Procedures (including critical care time)  Medications Ordered in UC Medications - No data to display  Initial Impression / Assessment and Plan / UC Course  I have reviewed the triage vital signs and the nursing notes.  Pertinent labs & imaging results that were available during my care of the patient were reviewed by me and considered in my medical decision making (see chart for details).   42 year old male presents for 1 month history of dry cough and wheezing.  No fever or recent worsening of symptoms.  Cough has improved minimally.  Denies sinus pain, sore throat, nasal congestion.  Taking OTC cough meds.  Vitals are normal and stable and the patient is overall well-appearing.  On exam he has small amount of clear postnasal drainage.  No nasal congestion noted.  Throat without erythema.   Chest clear to auscultation and heart regular rate and rhythm.  Chest x-ray obtained given persistence of cough x 1 month and subjective wheezing.  X-ray normal.  Reviewed results with patient and mother.  Likely viral bronchitis.  Supportive care encouraged.  Sent  Promethazine DM to pharmacy.  Reviewed return and ED precautions.   Final Clinical Impressions(s) / UC Diagnoses   Final diagnoses:  Acute cough  Viral illness     Discharge Instructions      -Normal chest x-ray.  You do not have pneumonia. - I sent cough medication to the pharmacy for you.  The symptoms are due to a virus and chest colds can last for a few weeks.  It is a good sign that your symptoms are improving. - If fever, worsening wheezing, fatigue or worsening cough please return for reevaluation.     ED Prescriptions     Medication Sig Dispense Auth. Provider   promethazine-dextromethorphan (PROMETHAZINE-DM) 6.25-15 MG/5ML syrup Take 5 mLs by mouth 4 (four) times daily as needed. 118 mL Shirlee Latch, PA-C      PDMP not reviewed this encounter.   Shirlee Latch, PA-C 03/30/23 1209

## 2023-03-30 NOTE — Discharge Instructions (Addendum)
-  Normal chest x-ray.  You do not have pneumonia. - I sent cough medication to the pharmacy for you.  The symptoms are due to a virus and chest colds can last for a few weeks.  It is a good sign that your symptoms are improving. - If fever, worsening wheezing, fatigue or worsening cough please return for reevaluation.

## 2023-05-09 ENCOUNTER — Encounter: Payer: Self-pay | Admitting: Emergency Medicine

## 2023-05-09 ENCOUNTER — Ambulatory Visit
Admission: EM | Admit: 2023-05-09 | Discharge: 2023-05-09 | Disposition: A | Discharge status: Home / Self Care | Payer: MEDICAID | Attending: Emergency Medicine | Admitting: Emergency Medicine

## 2023-05-09 DIAGNOSIS — J069 Acute upper respiratory infection, unspecified: Secondary | ICD-10-CM | POA: Diagnosis not present

## 2023-05-09 DIAGNOSIS — R051 Acute cough: Secondary | ICD-10-CM | POA: Diagnosis not present

## 2023-05-09 MED ORDER — CEFDINIR 250 MG/5ML PO SUSR: 300.0000 mg | Freq: Two times a day (BID) | ORAL | 0 refills | Status: AC

## 2023-05-09 MED ORDER — BENZONATATE 100 MG PO CAPS: 200.0000 mg | ORAL_CAPSULE | Freq: Three times a day (TID) | ORAL | 0 refills | Status: AC

## 2023-05-09 MED ORDER — IPRATROPIUM BROMIDE 0.06 % NA SOLN: 2.0000 | Freq: Four times a day (QID) | NASAL | 12 refills | Status: AC

## 2023-05-09 MED ORDER — PROMETHAZINE-DM 6.25-15 MG/5ML PO SYRP: 5.0000 mL | ORAL_SOLUTION | Freq: Four times a day (QID) | ORAL | 0 refills | Status: AC | PRN

## 2023-05-09 NOTE — Discharge Instructions (Addendum)
Take the cefdinir 300 mg twice daily with food for 7 days for treatment of your upper respiratory infection.  Use the Tessalon Perles every 8 hours during the day as needed for cough.  Take them with a small sip of water.  They may give you numbness to the base of your tongue or metallic taste in mouth, this is normal.  Use the Atrovent nasal spray, 2 squirts of each nostril every 6 hours as needed for nasal congestion and postnasal drip.  Use the Promethazine DM cough syrup at bedtime as needed for cough and congestion.  If you develop any new or worsening symptoms please return for reevaluation or see your PCP.

## 2023-05-09 NOTE — ED Triage Notes (Signed)
Patient reports cough, chest congestion and runny nose for the past 2 weeks.  Patient denies fevers.

## 2023-05-09 NOTE — ED Provider Notes (Signed)
[facility]    CSN: [id number] Arrival date & time: 05/09/23  1108      History   Chief Complaint Chief Complaint  Patient presents with   Cough   Nasal Congestion    HPI Donald Watson is a 42 y.o. male.   HPI  42 year old male with a past medical history significant for seizures and autism presents for evaluation of 2 weeks worth of respiratory symptoms to include runny nose with yellow nasal discharge and a cough that is productive for clear sputum.  She denies any fever, ear pain, sore throat, shortness breath, or wheezing.  Past Medical History:  Diagnosis Date   Autism    Seizures (HCC)     There are no problems to display for this patient.   Past Surgical History:  Procedure Laterality Date   EYE SURGERY         Home Medications    Prior to Admission medications   Medication Sig Start Date End Date Taking? Authorizing Provider  benzonatate (TESSALON) 100 MG capsule Take 2 capsules (200 mg total) by mouth every 8 (eight) hours. 05/09/23  Yes Becky Augusta, NP  cefdinir (OMNICEF) 250 MG/5ML suspension Take 6 mLs (300 mg total) by mouth 2 (two) times daily for 7 days. 05/09/23 05/16/23 Yes Becky Augusta, NP  ipratropium (ATROVENT) 0.06 % nasal spray Place 2 sprays into both nostrils 4 (four) times daily. 05/09/23  Yes Becky Augusta, NP  levETIRAcetam (KEPPRA) 100 MG/ML solution Take 1,000 mg by mouth 2 (two) times daily.   Yes [provider]  promethazine-dextromethorphan (PROMETHAZINE-DM) 6.25-15 MG/5ML syrup Take 5 mLs by mouth 4 (four) times daily as needed. 05/09/23  Yes Becky Augusta, NP  betamethasone dipropionate (DIPROLENE) 0.05 % ointment Apply topically 2 (two) times daily. 03/11/21   Becky Augusta, NP    Family History Family History  Problem Relation Age of Onset   Hypertension Mother    Diabetes Father     Social History Social History   Tobacco Use   Smoking status: Never   Smokeless tobacco: Never  Vaping Use   Vaping  status: Never Used  Substance Use Topics   Alcohol use: No   Drug use: No     Allergies   Patient has no known allergies.   Review of Systems Review of Systems  Constitutional:  Negative for fever.  HENT:  Positive for congestion and rhinorrhea. Negative for ear pain and sore throat.   Respiratory:  Positive for cough. Negative for shortness of breath and wheezing.      Physical Exam Triage Vital Signs ED Triage Vitals  Encounter Vitals Group     BP      Systolic BP Percentile      Diastolic BP Percentile      Pulse      Resp      Temp      Temp src      SpO2      Weight      Height      Head Circumference      Peak Flow      Pain Score      Pain Loc      Pain Education      Exclude from Growth Chart    No data found.  Updated Vital Signs BP 118/84 (BP Location: Right Arm)   Pulse 81   Temp 98.4 F (36.9 C) (Oral)   Resp 15   Ht 5\' 8"  (1.727 m)  Wt 240 lb 1.3 oz (108.9 kg)   SpO2 97%   BMI 36.50 kg/m   Visual Acuity Right Eye Distance:   Left Eye Distance:   Bilateral Distance:    Right Eye Near:   Left Eye Near:    Bilateral Near:     Physical Exam Vitals and nursing note reviewed.  Constitutional:      Appearance: Normal appearance. He is not ill-appearing.  HENT:     Head: Normocephalic and atraumatic.     Right Ear: Tympanic membrane, ear canal and external ear normal. There is no impacted cerumen.     Left Ear: Tympanic membrane, ear canal and external ear normal. There is no impacted cerumen.     Nose: Congestion and rhinorrhea present.     Comments: These mucosa is edematous and mildly erythematous with scant clear discharge in both nares.  No tenderness to compression of frontal or maxillary sinuses bilaterally.    Mouth/Throat:     Mouth: Mucous membranes are moist.     Pharynx: Oropharynx is clear. No oropharyngeal exudate or posterior oropharyngeal erythema.  Cardiovascular:     Rate and Rhythm: Normal rate and regular rhythm.      Pulses: Normal pulses.     Heart sounds: Normal heart sounds. No murmur heard.    No friction rub. No gallop.  Pulmonary:     Effort: Pulmonary effort is normal.     Breath sounds: Normal breath sounds. No wheezing, rhonchi or rales.  Musculoskeletal:     Cervical back: Normal range of motion and neck supple.  Lymphadenopathy:     Cervical: No cervical adenopathy.  Skin:    General: Skin is warm and dry.     Capillary Refill: Capillary refill takes less than 2 seconds.     Findings: No rash.  Neurological:     General: No focal deficit present.     Mental Status: He is alert and oriented to person, place, and time.      UC Treatments / Results  Labs (all labs ordered are listed, but only abnormal results are displayed) Labs Reviewed - No data to display  EKG   Radiology No results found.  Procedures Procedures (including critical care time)  Medications Ordered in UC Medications - No data to display  Initial Impression / Assessment and Plan / UC Course  I have reviewed the triage vital signs and the nursing notes.  Pertinent labs & imaging results that were available during my care of the patient were reviewed by me and considered in my medical decision making (see chart for details).   Patient is a nontoxic-appearing 42 year old male presenting for evaluation of 2 weeks worth of respiratory symptoms as outlined in HPI above.  His symptoms are fairly benign and his physical exam is not significantly impressive.  He does have some inflamed nasal mucosa with remainder of his upper or lower respiratory tract is unremarkable.  I do feel his symptoms are most likely viral, though given the protracted length of time I do feel a trial of antibiotics is warranted.  He would like to do a a trial of antibiotics this time.  I will discharge him home on cefdinir 300 mg twice daily for 7 days for treatment of his URI along with Atrovent nasal spray, Tessalon Perles, Promethazine DM  cough syrup to help with cough and congestion.  He reports that he cannot take pills he has to have liquid medications.   Final Clinical Impressions(s) / UC Diagnoses  Final diagnoses:  Upper respiratory tract infection, unspecified type  Acute cough     Discharge Instructions      Take the cefdinir 300 mg twice daily with food for 7 days for treatment of your upper respiratory infection.  Use the Tessalon Perles every 8 hours during the day as needed for cough.  Take them with a small sip of water.  They may give you numbness to the base of your tongue or metallic taste in mouth, this is normal.  Use the Atrovent nasal spray, 2 squirts of each nostril every 6 hours as needed for nasal congestion and postnasal drip.  Use the Promethazine DM cough syrup at bedtime as needed for cough and congestion.  If you develop any new or worsening symptoms please return for reevaluation or see your PCP.     ED Prescriptions     Medication Sig Dispense Auth. Provider   benzonatate (TESSALON) 100 MG capsule Take 2 capsules (200 mg total) by mouth every 8 (eight) hours. 21 capsule Becky Augusta, NP   cefdinir (OMNICEF) 250 MG/5ML suspension Take 6 mLs (300 mg total) by mouth 2 (two) times daily for 7 days. 84 mL Becky Augusta, NP   ipratropium (ATROVENT) 0.06 % nasal spray Place 2 sprays into both nostrils 4 (four) times daily. 15 mL Becky Augusta, NP   promethazine-dextromethorphan (PROMETHAZINE-DM) 6.25-15 MG/5ML syrup Take 5 mLs by mouth 4 (four) times daily as needed. 118 mL Becky Augusta, NP      PDMP not reviewed this encounter.   Becky Augusta, NP 05/09/23 1159

## 2023-08-10 ENCOUNTER — Encounter: Payer: Self-pay | Admitting: Emergency Medicine

## 2023-08-10 ENCOUNTER — Ambulatory Visit
Admission: EM | Admit: 2023-08-10 | Discharge: 2023-08-10 | Disposition: A | Discharge status: Home / Self Care | Payer: MEDICAID | Attending: Emergency Medicine | Admitting: Emergency Medicine

## 2023-08-10 DIAGNOSIS — J0100 Acute maxillary sinusitis, unspecified: Secondary | ICD-10-CM

## 2023-08-10 DIAGNOSIS — R09A2 Foreign body sensation, throat: Secondary | ICD-10-CM

## 2023-08-10 MED ORDER — AMOXICILLIN 875 MG PO TABS: 875.0000 mg | ORAL_TABLET | Freq: Two times a day (BID) | ORAL | 0 refills | Status: DC

## 2023-08-10 MED ORDER — AMOXICILLIN-POT CLAVULANATE 875-125 MG PO TABS: 1.0000 | ORAL_TABLET | Freq: Two times a day (BID) | ORAL | 0 refills | Status: AC

## 2023-08-10 NOTE — ED Provider Notes (Signed)
[facility]    CSN: [id number] Arrival date & time: 08/10/23  1210      History   Chief Complaint Chief Complaint  Patient presents with   Cough   Nasal Congestion    HPI Donald Watson is a 42 y.o. male.   42 year old male patient, Donald Watson, presents to urgent care for evaluation of cough, congestion and excessive mucus for several weeks.  Patient "feels like something stuck in throat today".  Patient is able to eat solids and liquids without issue.   Past medical history autism  The history is provided by the patient. No language interpreter was used.    Past Medical History:  Diagnosis Date   Autism    Seizures [facility])     Patient Active Problem List   Diagnosis Date Noted   Acute maxillary sinusitis 08/10/2023   Foreign body sensation, throat 08/10/2023    Past Surgical History:  Procedure Laterality Date   EYE SURGERY         Home Medications    Prior to Admission medications   Medication Sig Start Date End Date Taking? Authorizing Provider  amoxicillin-clavulanate (AUGMENTIN) 875-125 MG tablet Take 1 tablet by mouth every 12 (twelve) hours. 08/10/23  Yes Theodore Rahrig, Para March, NP  benzonatate (TESSALON) 100 MG capsule Take 2 capsules (200 mg total) by mouth every 8 (eight) hours. 05/09/23   Becky Augusta, NP  betamethasone dipropionate (DIPROLENE) 0.05 % ointment Apply topically 2 (two) times daily. 03/11/21   Becky Augusta, NP  ipratropium (ATROVENT) 0.06 % nasal spray Place 2 sprays into both nostrils 4 (four) times daily. 05/09/23   Becky Augusta, NP  levETIRAcetam (KEPPRA) 100 MG/ML solution Take 1,000 mg by mouth 2 (two) times daily.    [provider]  promethazine-dextromethorphan (PROMETHAZINE-DM) 6.25-15 MG/5ML syrup Take 5 mLs by mouth 4 (four) times daily as needed. 05/09/23   Becky Augusta, NP    Family History Family History  Problem Relation Age of Onset   Hypertension Mother    Diabetes Father     Social  History Social History   Tobacco Use   Smoking status: Never   Smokeless tobacco: Never  Vaping Use   Vaping status: Never Used  Substance Use Topics   Alcohol use: No   Drug use: No     Allergies   Patient has no known allergies.   Review of Systems Review of Systems  Constitutional:  Negative for fever.  HENT:  Positive for congestion.   Respiratory:  Positive for cough.   All other systems reviewed and are negative.    Physical Exam Triage Vital Signs ED Triage Vitals  Encounter Vitals Group     BP 08/10/23 1228 121/83     Systolic BP Percentile --      Diastolic BP Percentile --      Pulse Rate 08/10/23 1228 83     Resp 08/10/23 1228 18     Temp 08/10/23 1228 98.2 F (36.8 C)     Temp Source 08/10/23 1228 Oral     SpO2 08/10/23 1228 96 %     Weight --      Height --      Head Circumference --      Peak Flow --      Pain Score 08/10/23 1227 0     Pain Loc --      Pain Education --      Exclude from Growth Chart --    No data found.  Updated Vital Signs BP 121/83 (BP Location: Left Arm)   Pulse 83   Temp 98.2 F (36.8 C) (Oral)   Resp 18   SpO2 96%   Visual Acuity Right Eye Distance:   Left Eye Distance:   Bilateral Distance:    Right Eye Near:   Left Eye Near:    Bilateral Near:     Physical Exam Vitals and nursing note reviewed.  Constitutional:      General: He is not in acute distress.    Appearance: He is well-developed. He is obese.  HENT:     Head: Normocephalic and atraumatic.     Right Ear: Tympanic membrane normal.     Left Ear: Tympanic membrane normal.     Nose:     Right Sinus: Maxillary sinus tenderness present.     Left Sinus: Maxillary sinus tenderness present.     Mouth/Throat:     Lips: Pink.     Mouth: Mucous membranes are moist.     Pharynx: Oropharynx is clear. Uvula midline. No posterior oropharyngeal erythema.  Eyes:     Conjunctiva/sclera: Conjunctivae normal.  Cardiovascular:     Rate and Rhythm: Normal  rate and regular rhythm.     Pulses: Normal pulses.     Heart sounds: Normal heart sounds. No murmur heard. Pulmonary:     Effort: Pulmonary effort is normal. No respiratory distress.     Breath sounds: Normal breath sounds and air entry.  Abdominal:     Palpations: Abdomen is soft.     Tenderness: There is no abdominal tenderness.  Musculoskeletal:        General: No swelling.     Cervical back: Neck supple.  Skin:    General: Skin is warm and dry.     Capillary Refill: Capillary refill takes less than 2 seconds.  Neurological:     General: No focal deficit present.     Mental Status: He is alert and oriented to person, place, and time.  Psychiatric:        Attention and Perception: Attention normal.        Mood and Affect: Mood normal.        Speech: Speech normal.        Behavior: Behavior is cooperative.      UC Treatments / Results  Labs (all labs ordered are listed, but only abnormal results are displayed) Labs Reviewed - No data to display  EKG   Radiology No results found.  Procedures Procedures (including critical care time)  Medications Ordered in UC Medications - No data to display  Initial Impression / Assessment and Plan / UC Course  I have reviewed the triage vital signs and the nursing notes.  Pertinent labs & imaging results that were available during my care of the patient were reviewed by me and considered in my medical decision making (see chart for details).    Discussed exam findings and plan of care with patient/family, strict go to ER precautions given.   Patient and family both verbalized understanding to this provider.  Ddx: Acute maxillary sinusitis, body sensation throat, viral illness, allergies Final Clinical Impressions(s) / UC Diagnoses   Final diagnoses:  Acute maxillary sinusitis, recurrence not specified  Foreign body sensation, throat     Discharge Instructions      May use over-the-counter Robitussin as label  directed.  Take antibiotic as directed.  Push fluids, avoid spicy, greasy, fried foods.  If this foreign body/throat sensation continues or you have  worsening symptoms or unable to keep liquids or food down go to the emergency room for further evaluation.      ED Prescriptions     Medication Sig Dispense Auth. Provider   amoxicillin (AMOXIL) 875 MG tablet  (Status: Discontinued) Take 1 tablet (875 mg total) by mouth 2 (two) times daily for 7 days. 14 tablet Jazmin Vensel, NP   amoxicillin-clavulanate (AUGMENTIN) 875-125 MG tablet Take 1 tablet by mouth every 12 (twelve) hours. 14 tablet Janiah Devinney, Para March, NP      PDMP not reviewed this encounter.   Clancy Gourd, NP 08/10/23 1627

## 2023-08-10 NOTE — ED Triage Notes (Addendum)
Pt c/o cough, chest congestion and excessive mucus for several weeks. Pt feels like he has something in his throat today.

## 2023-08-10 NOTE — Discharge Instructions (Addendum)
May use over-the-counter Robitussin as label directed.  Take antibiotic as directed.  Push fluids, avoid spicy, greasy, fried foods.  If this foreign body/throat sensation continues or you have worsening symptoms or unable to keep liquids or food down go to the emergency room for further evaluation.

## 2024-01-19 ENCOUNTER — Other Ambulatory Visit: Payer: Self-pay | Admitting: Gastroenterology

## 2024-01-19 DIAGNOSIS — R748 Abnormal levels of other serum enzymes: Secondary | ICD-10-CM

## 2024-02-02 ENCOUNTER — Ambulatory Visit
Admission: RE | Admit: 2024-02-02 | Discharge: 2024-02-02 | Disposition: A | Discharge status: Home / Self Care | Payer: MEDICAID | Source: Ambulatory Visit | Attending: Gastroenterology | Admitting: Gastroenterology

## 2024-02-02 DIAGNOSIS — R748 Abnormal levels of other serum enzymes: Secondary | ICD-10-CM
